# Patient Record
Sex: Male | Born: 1960 | Race: White | Hispanic: No | Marital: Married
Health system: Southern US, Community
[De-identification: ages and names within clinical notes are randomized; demographics above are authoritative.]

---

## 2001-01-31 ENCOUNTER — Ambulatory Visit (HOSPITAL_BASED_OUTPATIENT_CLINIC_OR_DEPARTMENT_OTHER): Admission: RE | Admit: 2001-01-31 | Discharge: 2001-01-31 | Payer: Self-pay | Admitting: Orthopedic Surgery

## 2006-08-06 ENCOUNTER — Encounter
Admission: RE | Admit: 2006-08-06 | Discharge: 2006-08-06 | Payer: Self-pay | Admitting: Physical Medicine and Rehabilitation

## 2006-08-22 ENCOUNTER — Emergency Department (HOSPITAL_COMMUNITY): Admission: EM | Admit: 2006-08-22 | Discharge: 2006-08-23 | Payer: Self-pay | Admitting: Emergency Medicine

## 2010-06-30 NOTE — Op Note (Signed)
[facility]  Patient:    Johnny Chavez, Johnny Chavez Visit Number: [id number] MRN: [id number]          Service Type: DSU Location: [facility] Attending Physician:  Milly Jakob Dictated by:   Harvie Junior, M.D. Proc. Date: 01/31/01 Admit Date:  01/31/2001                             Operative Report  PREOPERATIVE DIAGNOSIS:  Degenerative joint disease medial compartment with questionable lateral meniscal tear.  POSTOPERATIVE DIAGNOSES: 1. 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 2. Posterior cruciate ligament insufficiency. 3. Posterolateral meniscal tear. 4. Multiple osteocartilaginous loose bodies.  PRINCIPAL PROCEDURES: 1. Lateral meniscectomy. 2. Debridement of medial femoral condyle osteochondral defect. 3. Removal of multiple osteocartilaginous loose bodies.  SURGEON:  Harvie Junior, M.D.  ASSISTANT:  Currie Paris. Thedore Mins.  ANESTHESIA:  Knee block.  BRIEF HISTORY:  He is a 50 year old male with a long history of having left knee pain.  He has had two previous knee surgeries and because of continued complaints of pain and recent problems with catching and locking, he ultimately presented for knee arthroscopy.  X-rays preoperatively showed an interesting area on the medial femoral condyle and just prior to surgery he said, he was starting to have some catching on the lateral side.  Examination of the knee preoperatively with the knee block in showed that really he was posteriorly very loose.  He had a sag.  He did not have a quadriceps active and it was interestingly similar to his examination on the opposite side.  At this point, the knee was prepped and draped in usual sterile fashion and routine arthroscopic examination of the knee revealed that there was an obvious posterolateral meniscal tear.  This was debrided with a combination of straight biting forceps and the remaining meniscal rim was  contoured down with the suction/shaver.  Attention was turned to the lateral femoral condyle, where there was some grade 2 change.  Attention was then turned to the notch, where the ACL was noted to be normal.  The PCL was noted to be deficient. Attention was turned to the medial side, where there was noted to be some grade 4 change just in the mid body of the medial meniscus.  The meniscus was within normal limits.  Other than having had a previous partial meniscectomy probably a bucket handle type.  Continued flexion of the knee showed that there was an obvious rest of unstable cartilage.  This was probed and noted to be completely unstable.  This was debrided back to a smooth and stable rim. Attention was then turned up into the patellofemoral pouch, where the patellofemoral joint was within normal limits, but there were multiple osteocartilaginous loose bodies up in that area.  These were debrided through the suction/shaver.  Attention was turned back into the weightbearing compartment, where more loose bodies were removed and then back up into the notch to remove all of the osteocartilaginous loose bodies.  At this point, the knee was copiously irrigated and suctioned dry.  The arthroscopic portals were closed with a bandage.  A sterile and compressive dressing was applied and the patient taken to recovery room, where she was noted to be satisfactory condition.  Estimated blood loss for the procedure was none. Dictated by:   Harvie Junior, M.D. Attending Physician:  Milly Jakob DD:  01/31/01  TD:  02/01/01 Job: [id number]

## 2010-11-28 LAB — CBC
HCT: 39.6
Hemoglobin: 14
MCHC: 35.4
MCV: 87.7
RDW: 12.6

## 2010-11-28 LAB — COMPREHENSIVE METABOLIC PANEL
Alkaline Phosphatase: 93
BUN: 16
Creatinine, Ser: 1.03
Glucose, Bld: 122 — ABNORMAL HIGH
Potassium: 4.1
Total Protein: 6.2

## 2010-11-28 LAB — DIFFERENTIAL
Basophils Absolute: 0
Basophils Relative: 0
Monocytes Relative: 7
Neutro Abs: 4.3
Neutrophils Relative %: 84 — ABNORMAL HIGH

## 2010-11-28 LAB — URINALYSIS, ROUTINE W REFLEX MICROSCOPIC
Ketones, ur: NEGATIVE
Nitrite: NEGATIVE
Protein, ur: NEGATIVE
pH: 7.5

## 2010-11-28 LAB — CARBAMAZEPINE LEVEL, TOTAL: Carbamazepine Lvl: <2 — ABNORMAL LOW

## 2015-12-07 DIAGNOSIS — J209 Acute bronchitis, unspecified: Secondary | ICD-10-CM | POA: Diagnosis not present

## 2016-04-27 DIAGNOSIS — G8929 Other chronic pain: Secondary | ICD-10-CM | POA: Diagnosis not present

## 2016-04-27 DIAGNOSIS — G40909 Epilepsy, unspecified, not intractable, without status epilepticus: Secondary | ICD-10-CM | POA: Diagnosis not present

## 2016-04-27 DIAGNOSIS — M25512 Pain in left shoulder: Secondary | ICD-10-CM | POA: Diagnosis not present

## 2016-04-27 DIAGNOSIS — Z0000 Encounter for general adult medical examination without abnormal findings: Secondary | ICD-10-CM | POA: Diagnosis not present

## 2016-11-30 DIAGNOSIS — M546 Pain in thoracic spine: Secondary | ICD-10-CM | POA: Diagnosis not present

## 2016-12-11 DIAGNOSIS — M9901 Segmental and somatic dysfunction of cervical region: Secondary | ICD-10-CM | POA: Diagnosis not present

## 2016-12-11 DIAGNOSIS — M62830 Muscle spasm of back: Secondary | ICD-10-CM | POA: Diagnosis not present

## 2016-12-11 DIAGNOSIS — M9903 Segmental and somatic dysfunction of lumbar region: Secondary | ICD-10-CM | POA: Diagnosis not present

## 2016-12-11 DIAGNOSIS — M9902 Segmental and somatic dysfunction of thoracic region: Secondary | ICD-10-CM | POA: Diagnosis not present

## 2016-12-20 DIAGNOSIS — M62830 Muscle spasm of back: Secondary | ICD-10-CM | POA: Diagnosis not present

## 2016-12-20 DIAGNOSIS — M545 Low back pain: Secondary | ICD-10-CM | POA: Diagnosis not present

## 2016-12-21 DIAGNOSIS — M62830 Muscle spasm of back: Secondary | ICD-10-CM | POA: Diagnosis not present

## 2016-12-21 DIAGNOSIS — M545 Low back pain: Secondary | ICD-10-CM | POA: Diagnosis not present

## 2016-12-24 DIAGNOSIS — M62830 Muscle spasm of back: Secondary | ICD-10-CM | POA: Diagnosis not present

## 2016-12-24 DIAGNOSIS — M545 Low back pain: Secondary | ICD-10-CM | POA: Diagnosis not present

## 2016-12-28 DIAGNOSIS — M62830 Muscle spasm of back: Secondary | ICD-10-CM | POA: Diagnosis not present

## 2016-12-28 DIAGNOSIS — M545 Low back pain: Secondary | ICD-10-CM | POA: Diagnosis not present

## 2017-01-01 DIAGNOSIS — M62830 Muscle spasm of back: Secondary | ICD-10-CM | POA: Diagnosis not present

## 2017-01-01 DIAGNOSIS — M545 Low back pain: Secondary | ICD-10-CM | POA: Diagnosis not present

## 2017-05-06 DIAGNOSIS — Z125 Encounter for screening for malignant neoplasm of prostate: Secondary | ICD-10-CM | POA: Diagnosis not present

## 2017-05-06 DIAGNOSIS — Z5181 Encounter for therapeutic drug level monitoring: Secondary | ICD-10-CM | POA: Diagnosis not present

## 2017-05-06 DIAGNOSIS — Z0000 Encounter for general adult medical examination without abnormal findings: Secondary | ICD-10-CM | POA: Diagnosis not present

## 2017-05-06 DIAGNOSIS — G40909 Epilepsy, unspecified, not intractable, without status epilepticus: Secondary | ICD-10-CM | POA: Diagnosis not present

## 2017-07-02 DIAGNOSIS — M79674 Pain in right toe(s): Secondary | ICD-10-CM | POA: Diagnosis not present

## 2017-07-02 DIAGNOSIS — F17220 Nicotine dependence, chewing tobacco, uncomplicated: Secondary | ICD-10-CM | POA: Diagnosis not present

## 2017-07-02 DIAGNOSIS — S62521A Displaced fracture of distal phalanx of right thumb, initial encounter for closed fracture: Secondary | ICD-10-CM | POA: Diagnosis not present

## 2017-07-02 DIAGNOSIS — S91111A Laceration without foreign body of right great toe without damage to nail, initial encounter: Secondary | ICD-10-CM | POA: Diagnosis not present

## 2017-07-02 DIAGNOSIS — S92414B Nondisplaced fracture of proximal phalanx of right great toe, initial encounter for open fracture: Secondary | ICD-10-CM | POA: Diagnosis not present

## 2017-07-02 DIAGNOSIS — W19XXXA Unspecified fall, initial encounter: Secondary | ICD-10-CM | POA: Diagnosis not present

## 2017-07-04 DIAGNOSIS — S92421B Displaced fracture of distal phalanx of right great toe, initial encounter for open fracture: Secondary | ICD-10-CM | POA: Diagnosis not present

## 2017-07-16 DIAGNOSIS — S92421B Displaced fracture of distal phalanx of right great toe, initial encounter for open fracture: Secondary | ICD-10-CM | POA: Diagnosis not present

## 2017-12-16 DIAGNOSIS — H1032 Unspecified acute conjunctivitis, left eye: Secondary | ICD-10-CM | POA: Diagnosis not present

## 2018-08-04 DIAGNOSIS — R1032 Left lower quadrant pain: Secondary | ICD-10-CM | POA: Diagnosis not present

## 2018-08-06 ENCOUNTER — Other Ambulatory Visit: Payer: Self-pay | Admitting: Internal Medicine

## 2018-08-06 ENCOUNTER — Ambulatory Visit
Admission: RE | Admit: 2018-08-06 | Discharge: 2018-08-06 | Disposition: A | Discharge status: Home / Self Care | Payer: BC Managed Care – PPO | Source: Ambulatory Visit | Attending: Internal Medicine | Admitting: Internal Medicine

## 2018-08-06 DIAGNOSIS — R109 Unspecified abdominal pain: Secondary | ICD-10-CM | POA: Diagnosis not present

## 2018-08-06 DIAGNOSIS — R1010 Upper abdominal pain, unspecified: Secondary | ICD-10-CM | POA: Diagnosis not present

## 2018-08-18 DIAGNOSIS — Z0000 Encounter for general adult medical examination without abnormal findings: Secondary | ICD-10-CM | POA: Diagnosis not present

## 2018-08-18 DIAGNOSIS — Z5181 Encounter for therapeutic drug level monitoring: Secondary | ICD-10-CM | POA: Diagnosis not present

## 2018-08-18 DIAGNOSIS — R109 Unspecified abdominal pain: Secondary | ICD-10-CM | POA: Diagnosis not present

## 2018-08-18 DIAGNOSIS — M25562 Pain in left knee: Secondary | ICD-10-CM | POA: Diagnosis not present

## 2018-08-18 DIAGNOSIS — Z23 Encounter for immunization: Secondary | ICD-10-CM | POA: Diagnosis not present

## 2018-08-18 DIAGNOSIS — G40909 Epilepsy, unspecified, not intractable, without status epilepticus: Secondary | ICD-10-CM | POA: Diagnosis not present

## 2018-08-18 DIAGNOSIS — G8929 Other chronic pain: Secondary | ICD-10-CM | POA: Diagnosis not present

## 2018-11-03 DIAGNOSIS — Z23 Encounter for immunization: Secondary | ICD-10-CM | POA: Diagnosis not present

## 2018-12-16 DIAGNOSIS — M7702 Medial epicondylitis, left elbow: Secondary | ICD-10-CM | POA: Diagnosis not present

## 2018-12-16 DIAGNOSIS — M25562 Pain in left knee: Secondary | ICD-10-CM | POA: Diagnosis not present

## 2018-12-16 DIAGNOSIS — M1712 Unilateral primary osteoarthritis, left knee: Secondary | ICD-10-CM | POA: Diagnosis not present

## 2019-02-17 DIAGNOSIS — M71342 Other bursal cyst, left hand: Secondary | ICD-10-CM | POA: Diagnosis not present

## 2019-08-20 DIAGNOSIS — Z125 Encounter for screening for malignant neoplasm of prostate: Secondary | ICD-10-CM | POA: Diagnosis not present

## 2019-08-20 DIAGNOSIS — G40909 Epilepsy, unspecified, not intractable, without status epilepticus: Secondary | ICD-10-CM | POA: Diagnosis not present

## 2019-08-20 DIAGNOSIS — Z1159 Encounter for screening for other viral diseases: Secondary | ICD-10-CM | POA: Diagnosis not present

## 2019-08-20 DIAGNOSIS — Z0000 Encounter for general adult medical examination without abnormal findings: Secondary | ICD-10-CM | POA: Diagnosis not present

## 2019-08-20 DIAGNOSIS — Z5181 Encounter for therapeutic drug level monitoring: Secondary | ICD-10-CM | POA: Diagnosis not present

## 2019-09-10 DIAGNOSIS — M1712 Unilateral primary osteoarthritis, left knee: Secondary | ICD-10-CM | POA: Diagnosis not present

## 2019-09-10 DIAGNOSIS — M25562 Pain in left knee: Secondary | ICD-10-CM | POA: Diagnosis not present

## 2019-10-22 DIAGNOSIS — Z20822 Contact with and (suspected) exposure to covid-19: Secondary | ICD-10-CM | POA: Diagnosis not present

## 2019-11-30 ENCOUNTER — Other Ambulatory Visit: Payer: BC Managed Care – PPO

## 2019-11-30 DIAGNOSIS — Z20822 Contact with and (suspected) exposure to covid-19: Secondary | ICD-10-CM | POA: Diagnosis not present

## 2019-12-01 LAB — SARS-COV-2, NAA 2 DAY TAT

## 2019-12-01 LAB — NOVEL CORONAVIRUS, NAA: SARS-CoV-2, NAA: NOT DETECTED

## 2019-12-29 DIAGNOSIS — Z20822 Contact with and (suspected) exposure to covid-19: Secondary | ICD-10-CM | POA: Diagnosis not present

## 2020-01-14 DIAGNOSIS — Z01818 Encounter for other preprocedural examination: Secondary | ICD-10-CM | POA: Diagnosis not present

## 2020-01-25 ENCOUNTER — Other Ambulatory Visit: Payer: Self-pay

## 2020-01-25 ENCOUNTER — Ambulatory Visit (INDEPENDENT_AMBULATORY_CARE_PROVIDER_SITE_OTHER): Payer: BC Managed Care – PPO | Admitting: Family Medicine

## 2020-01-25 DIAGNOSIS — M1712 Unilateral primary osteoarthritis, left knee: Secondary | ICD-10-CM | POA: Diagnosis not present

## 2020-01-25 NOTE — Progress Notes (Signed)
I saw and examined the patient with Dr. Marga Hoots and agree with assessment and plan as outlined.    Chronic left knee pain, getting ready for replacement at end of month per Dr. Luiz Blare.  Wife wanted him to be sure he'd tried all conservative options.  Unfortunately, it appears that he's tried and failed most options.  I recommended proceeding with replacement as planned.  Also complaining of decreased flexion of left elbow since waterskiing injury several years ago.  No pain.  Recommended against intervention unless starts having more pain, since he's functional right now.

## 2020-01-25 NOTE — Progress Notes (Signed)
Office Visit Note   Patient: Johnny Chavez           Date of Birth: April 10, 1960           MRN: [id number] Visit Date: 01/25/2020 Requested by: Georgann Housekeeper, MD [address] [place],  [place] [address] PCP: Georgann Housekeeper, MD  Subjective: Chief Complaint  Patient presents with   Left Knee - Pain    H/o 4 surgeries on the knee since 1978. Here to get a 2nd opinion on getting a knee replacement. Has a knee replacement scheduled for 02/10/20.    HPI: 59yo M presenting to clinic for chronic knee pain/stiffness evaluation. Patient is scheduled to undergo a knee replacement in approximately two weeks, however he wanted to seek a second opinion if there is any more conservative options he can try. 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 He has tried multiple steroid shots with minimal benefit, and his insurance will not cover gel injections. He is active daily- usually using a stationary bike or elliptical. He is also an avid golfer. Previously, he was a runner, but says he has not done this for many years due to knee pain. He says his knee is 'constantly' swollen. He has worn knee sleeves without much relief. Has tried NSAIDs (Which started to cause easy bruising, so he discontinued). He says he is otherwise healthy.                ROS:   All other systems were reviewed and are negative.  Objective: Vital Signs: There were no vitals taken for this visit.  Physical Exam:  General:  Alert and oriented, in no acute distress. Pulm:  Breathing unlabored. Psy:  Normal mood, congruent affect. Skin:  Left knee with no bruising, rashes, or erythema. Overlying skin intact.   LEFT KNEE EXAM:  General: Antalgic gait, favoring left knee Standing exam: Mild varus deformity of the knee  Seated Exam:  Moderate patellar crepitus with flexion/extension, Negative J-Sign.   Palpation: Endorses tenderness to  palpation over ledial > medial joint lines. Some tenderness with patellar compression. No pain with palpation of patellar tendon.  Supine exam: Moderate effusion, normal patellar mobility.   Ligamentous Exam:  No pain or laxity with anterior/posterior drawer.  No obvious Sag.  Pseudolaxity with varus/valgus stress across the knee, though non-painful.   Meniscus:  McMurray with no pain or deep clicking.  Thessaly negative.   Strength: Hip flexion (L1), Hip Aduction (L2), Knee Extension (L3) are 5/5 Bilaterally Foot Inversion (L4), Dorsiflexion (L5), and Eversion (S1) 5/5 Bilaterally  Sensation: Intact to light touch medial and lateral aspects of lower extremities, and lateral, dorsal, and medial aspects of foot.    Imaging: No results found.  Assessment & Plan: 59yo M presenting to clinic for second opinion prior to pursuing knee replacement. Has already undergone steroid injections, with no benefit. Crepitus and effusion on examination. Discussed possible Prolo vs PRP injections, though evidence isn't outstanding for either. Discussed offloading brace. Given his relative youth and good health, as well as daily symptoms that are limiting his ability to participate fully in his desired activities (Golfing), suspect he would indeed benefit from knee replacement. Follow up with established surgeon as previously scheduled.      Procedures: No procedures performed        PMFS History: There are no problems to display for this patient.  No  past medical history on file.  No family history on file.   Social History   Occupational History   Not on file  Tobacco Use   Smoking status: Not on file   Smokeless tobacco: Not on file  Substance and Sexual Activity   Alcohol use: Not on file   Drug use: Not on file   Sexual activity: Not on file

## 2020-01-28 DIAGNOSIS — M25562 Pain in left knee: Secondary | ICD-10-CM | POA: Diagnosis not present

## 2020-01-28 DIAGNOSIS — Z20822 Contact with and (suspected) exposure to covid-19: Secondary | ICD-10-CM | POA: Diagnosis not present

## 2020-01-28 DIAGNOSIS — M1712 Unilateral primary osteoarthritis, left knee: Secondary | ICD-10-CM | POA: Diagnosis not present

## 2020-02-10 DIAGNOSIS — M1712 Unilateral primary osteoarthritis, left knee: Secondary | ICD-10-CM | POA: Diagnosis not present

## 2020-02-23 DIAGNOSIS — Z9889 Other specified postprocedural states: Secondary | ICD-10-CM | POA: Diagnosis not present

## 2020-03-30 DIAGNOSIS — Z20822 Contact with and (suspected) exposure to covid-19: Secondary | ICD-10-CM | POA: Diagnosis not present

## 2020-04-12 DIAGNOSIS — Z03818 Encounter for observation for suspected exposure to other biological agents ruled out: Secondary | ICD-10-CM | POA: Diagnosis not present

## 2020-04-12 DIAGNOSIS — Z20822 Contact with and (suspected) exposure to covid-19: Secondary | ICD-10-CM | POA: Diagnosis not present

## 2020-06-23 DIAGNOSIS — Z20822 Contact with and (suspected) exposure to covid-19: Secondary | ICD-10-CM | POA: Diagnosis not present

## 2020-07-18 DIAGNOSIS — T1512XA Foreign body in conjunctival sac, left eye, initial encounter: Secondary | ICD-10-CM | POA: Diagnosis not present

## 2020-08-16 DIAGNOSIS — Z09 Encounter for follow-up examination after completed treatment for conditions other than malignant neoplasm: Secondary | ICD-10-CM | POA: Diagnosis not present

## 2020-08-16 DIAGNOSIS — Z471 Aftercare following joint replacement surgery: Secondary | ICD-10-CM | POA: Diagnosis not present

## 2020-08-16 DIAGNOSIS — Z96652 Presence of left artificial knee joint: Secondary | ICD-10-CM | POA: Diagnosis not present

## 2020-08-23 DIAGNOSIS — G40909 Epilepsy, unspecified, not intractable, without status epilepticus: Secondary | ICD-10-CM | POA: Diagnosis not present

## 2020-08-23 DIAGNOSIS — Z0000 Encounter for general adult medical examination without abnormal findings: Secondary | ICD-10-CM | POA: Diagnosis not present

## 2020-11-01 DIAGNOSIS — M62838 Other muscle spasm: Secondary | ICD-10-CM | POA: Diagnosis not present

## 2020-11-01 DIAGNOSIS — M9905 Segmental and somatic dysfunction of pelvic region: Secondary | ICD-10-CM | POA: Diagnosis not present

## 2020-11-01 DIAGNOSIS — M9903 Segmental and somatic dysfunction of lumbar region: Secondary | ICD-10-CM | POA: Diagnosis not present

## 2020-11-01 DIAGNOSIS — M9902 Segmental and somatic dysfunction of thoracic region: Secondary | ICD-10-CM | POA: Diagnosis not present

## 2020-11-12 DIAGNOSIS — J209 Acute bronchitis, unspecified: Secondary | ICD-10-CM | POA: Diagnosis not present

## 2020-11-21 IMAGING — CT CT ABDOMEN AND PELVIS WITHOUT CONTRAST
2 of 4 series · 12 of 46 positions shown, 14 images · non-contrast
Comparison: No priors.

CLINICAL DATA: 58-year-old male with history of left-sided
abdominal pain for the past several days extending from the flank to
the pelvis.

EXAM:
CT ABDOMEN AND PELVIS WITHOUT CONTRAST
TECHNIQUE: Multidetector CT imaging of the abdomen and pelvis was performed
following the standard protocol without IV contrast.

[Series 2: renal stone 5.00 br40 s3 axial · axial · 0.58mm/px · z∈[+1175,+1595]mm · 9 of 101 slices shown, 11 images]
[im 9/101  soft-tissue]
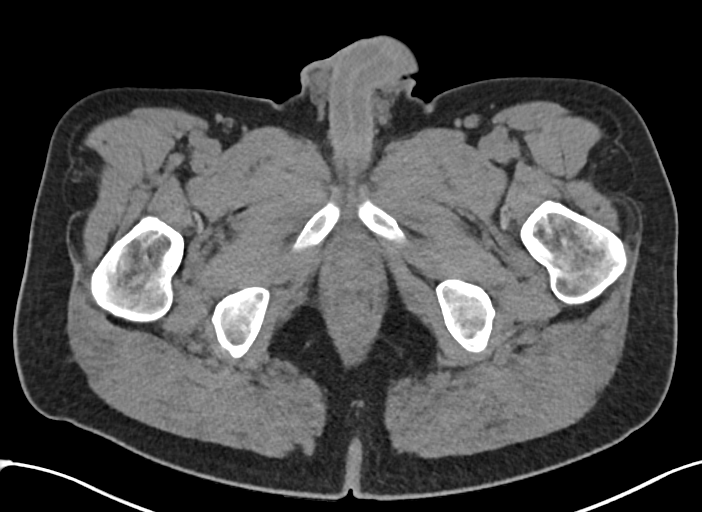
[im 9/101  bone]
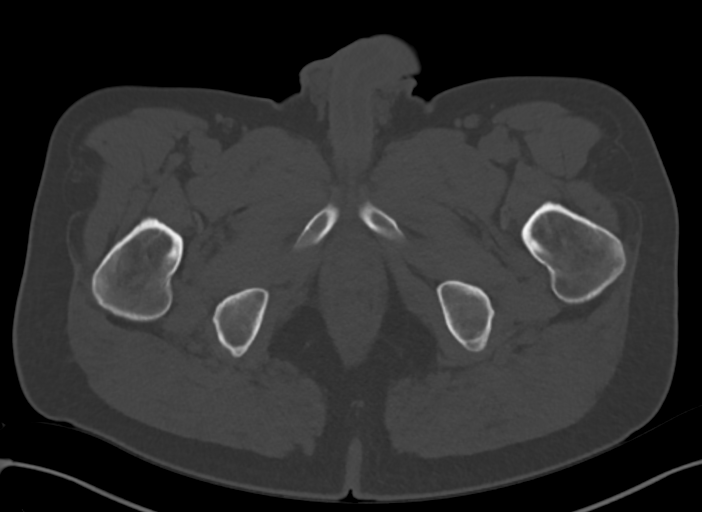
[im 21/101  soft-tissue]
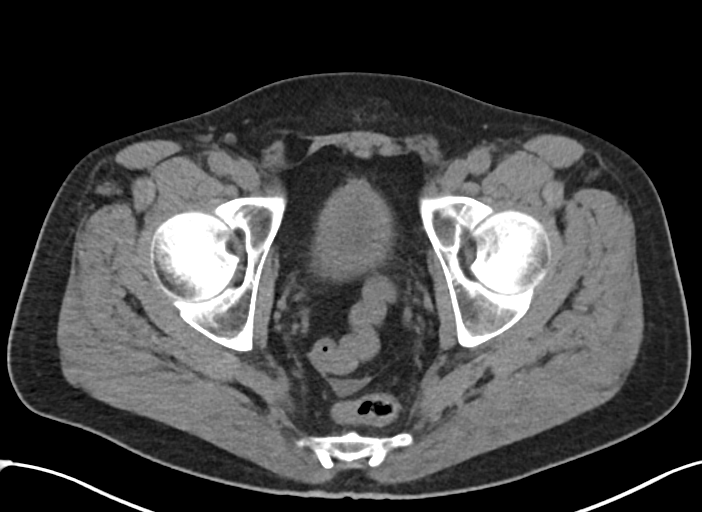
[im 29/101  soft-tissue]
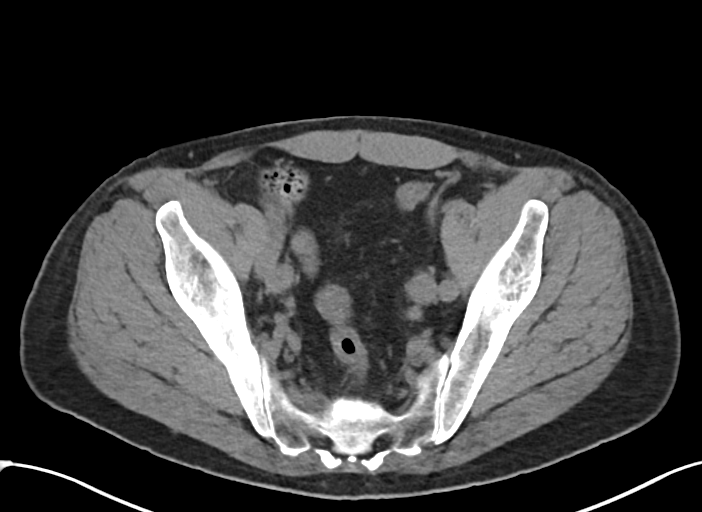
[im 41/101  soft-tissue]
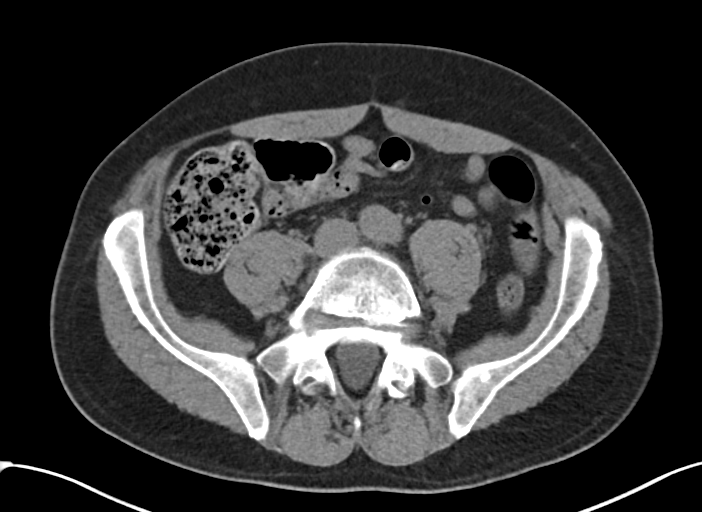
[im 53/101  soft-tissue]
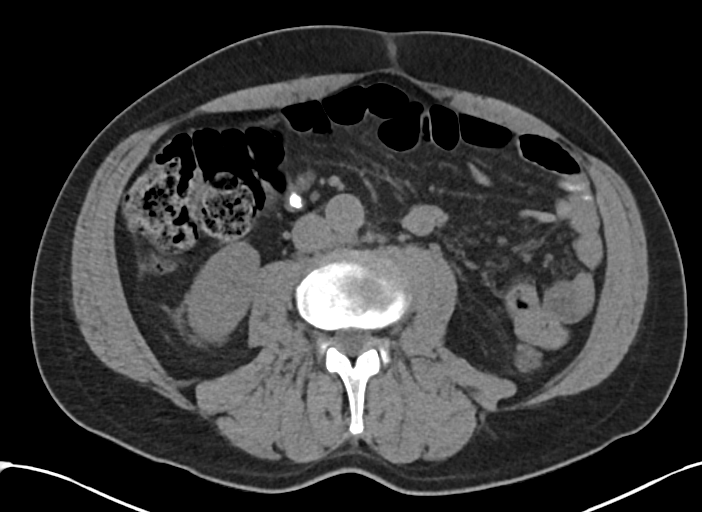
[im 61/101  soft-tissue]
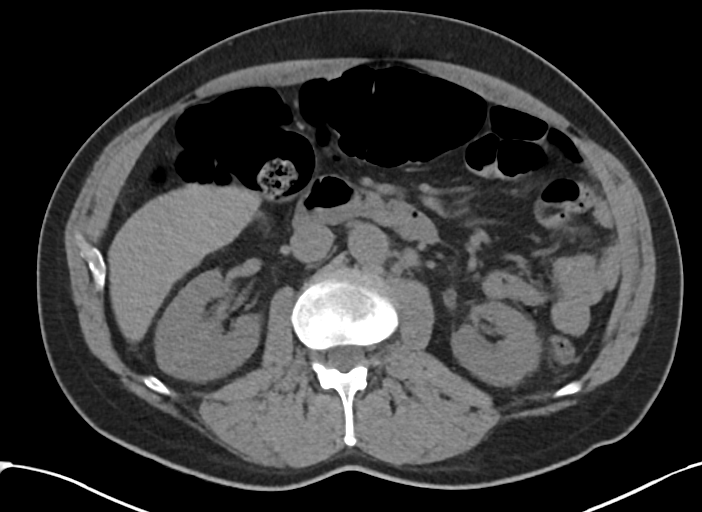
[im 73/101  soft-tissue]
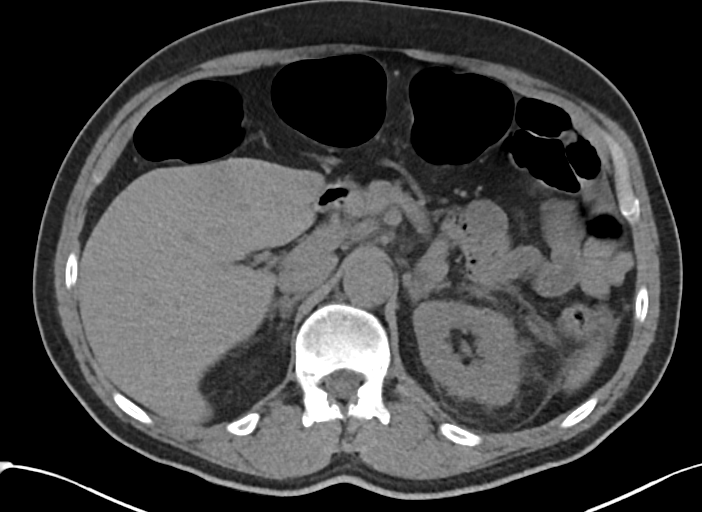
[im 85/101  soft-tissue]
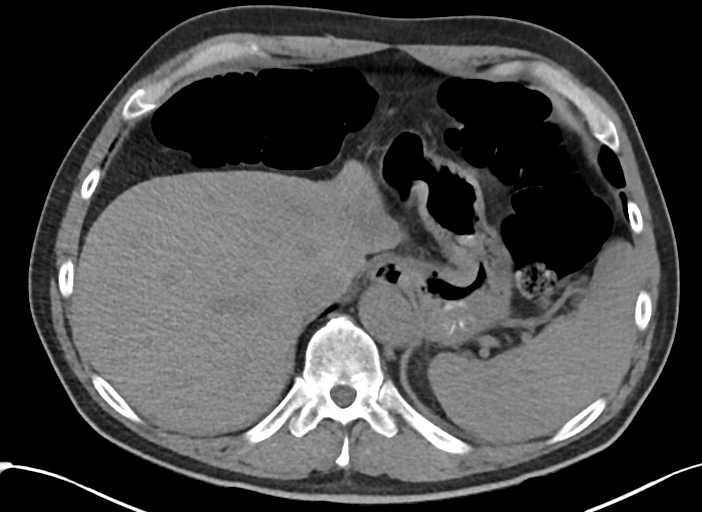
[im 93/101  soft-tissue]
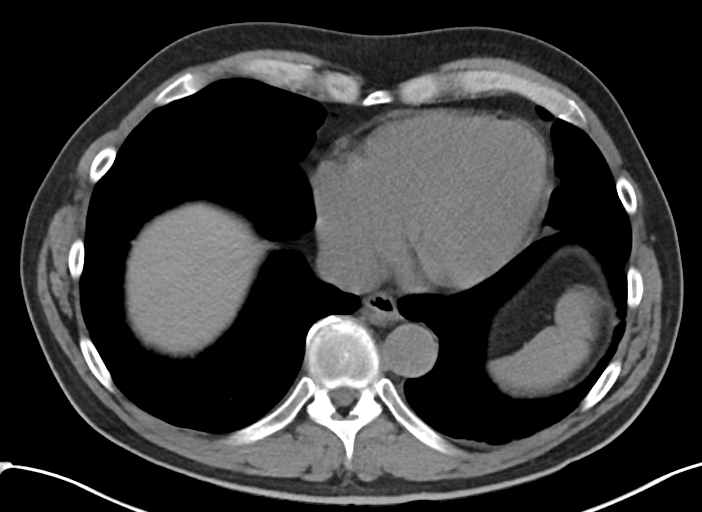
[im 93/101  bone]
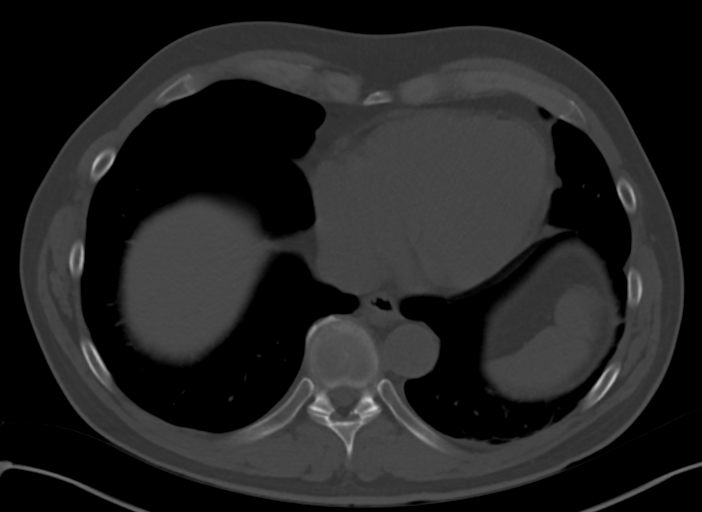

[Series 6: renal stone 2.00 br40 s3 cor · coronal · 0.79mm/px · 3 of 147 slices shown]
[im 49/147  soft-tissue]
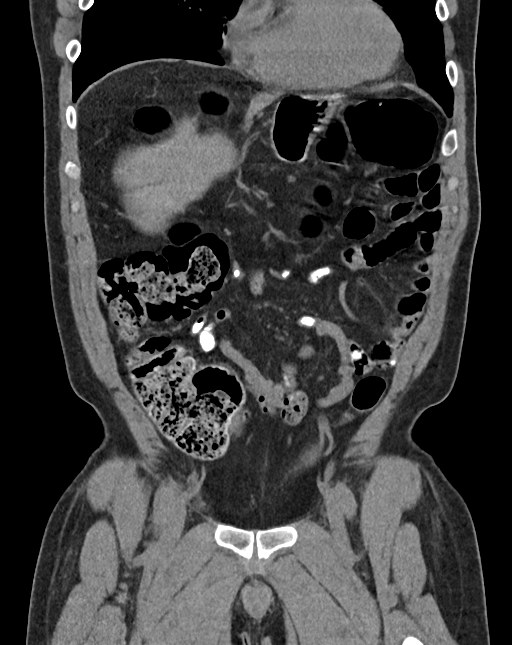
[im 65/147  soft-tissue]
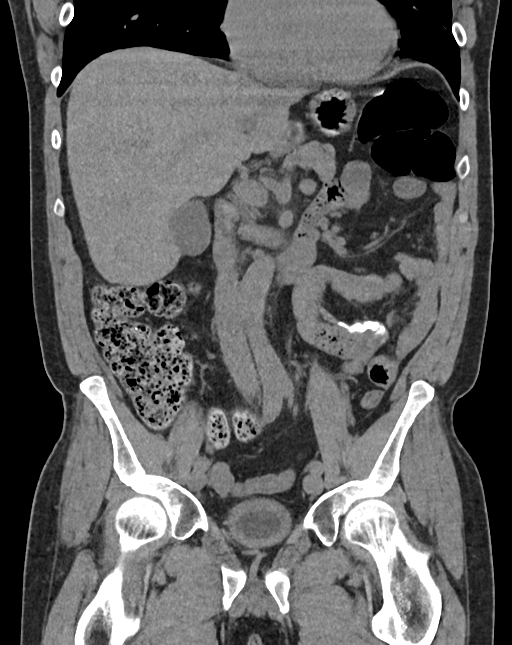
[im 82/147  soft-tissue]
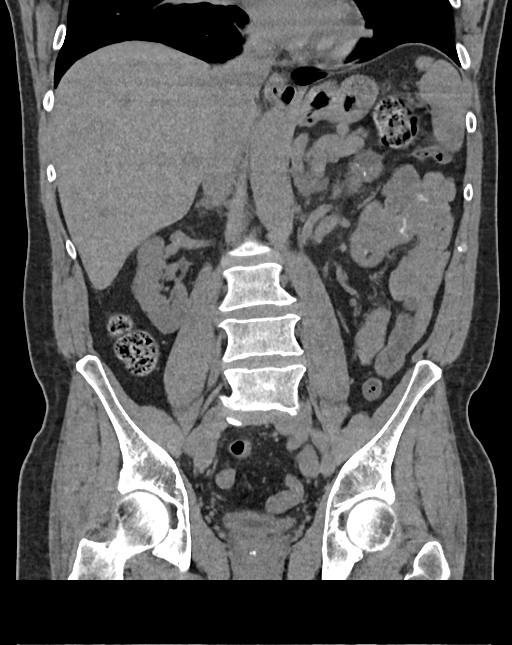

[12 of 46 positions shown; findings below may reference images not displayed]

FINDINGS: Lower chest: Unremarkable.

Hepatobiliary: No definite suspicious cystic or solid hepatic
lesions are confidently identified on today's noncontrast CT
examination. Small calcified granuloma in segment 5 of the liver.
Unenhanced appearance of the gallbladder is normal.

Pancreas: No definite pancreatic mass or peripancreatic fluid
collections or inflammatory changes are noted on today's noncontrast
CT examination.

Spleen: Unremarkable.

Adrenals/Urinary Tract: There are no abnormal calcifications within
the collecting system of either kidney, along the course of either
ureter, or within the lumen of the urinary bladder. No
hydroureteronephrosis to suggest urinary tract obstruction at this
time. However, there is extensive perinephric stranding adjacent to
the left kidney. The unenhanced appearance of the kidneys is
otherwise unremarkable bilaterally. Urinary bladder is normal in
appearance. Bilateral adrenal glands are normal in appearance.

Stomach/Bowel: Unenhanced appearance of the stomach is normal. No
pathologic dilatation of small bowel or colon. Normal appendix.

Vascular/Lymphatic: No atherosclerotic calcifications in the
abdominal aorta or pelvic vasculature. No lymphadenopathy noted in
the abdomen or pelvis.

Reproductive: Prostate gland and seminal vesicles are unremarkable
in appearance.

Other: No significant volume of ascites.  No pneumoperitoneum.

Musculoskeletal: There are no aggressive appearing lytic or blastic
lesions noted in the visualized portions of the skeleton.
IMPRESSION: 1. Extensive perinephric stranding adjacent to the left kidney. No
urinary tract calculi or hydroureteronephrosis to suggest current
urinary tract obstruction. Findings are nonspecific and could relate
to upper tract infection such as pyelonephritis, or could indicate
recent passage of a stone.

## 2021-01-25 DIAGNOSIS — H31003 Unspecified chorioretinal scars, bilateral: Secondary | ICD-10-CM | POA: Diagnosis not present

## 2021-02-23 DIAGNOSIS — M25562 Pain in left knee: Secondary | ICD-10-CM | POA: Diagnosis not present

## 2021-02-23 DIAGNOSIS — M1712 Unilateral primary osteoarthritis, left knee: Secondary | ICD-10-CM | POA: Diagnosis not present

## 2021-03-06 DIAGNOSIS — R002 Palpitations: Secondary | ICD-10-CM | POA: Diagnosis not present

## 2021-03-06 DIAGNOSIS — R001 Bradycardia, unspecified: Secondary | ICD-10-CM | POA: Diagnosis not present

## 2021-03-07 DIAGNOSIS — R002 Palpitations: Secondary | ICD-10-CM | POA: Diagnosis not present

## 2021-03-30 DIAGNOSIS — R002 Palpitations: Secondary | ICD-10-CM | POA: Diagnosis not present

## 2021-08-29 DIAGNOSIS — R030 Elevated blood-pressure reading, without diagnosis of hypertension: Secondary | ICD-10-CM | POA: Diagnosis not present

## 2021-08-29 DIAGNOSIS — Z0000 Encounter for general adult medical examination without abnormal findings: Secondary | ICD-10-CM | POA: Diagnosis not present

## 2021-08-29 DIAGNOSIS — G40909 Epilepsy, unspecified, not intractable, without status epilepticus: Secondary | ICD-10-CM | POA: Diagnosis not present

## 2021-08-29 DIAGNOSIS — I471 Supraventricular tachycardia: Secondary | ICD-10-CM | POA: Diagnosis not present

## 2021-08-29 DIAGNOSIS — Z1159 Encounter for screening for other viral diseases: Secondary | ICD-10-CM | POA: Diagnosis not present

## 2021-08-29 DIAGNOSIS — Z125 Encounter for screening for malignant neoplasm of prostate: Secondary | ICD-10-CM | POA: Diagnosis not present

## 2021-08-29 DIAGNOSIS — Z13220 Encounter for screening for lipoid disorders: Secondary | ICD-10-CM | POA: Diagnosis not present

## 2022-02-23 DIAGNOSIS — K635 Polyp of colon: Secondary | ICD-10-CM | POA: Diagnosis not present

## 2022-02-23 DIAGNOSIS — K648 Other hemorrhoids: Secondary | ICD-10-CM | POA: Diagnosis not present

## 2022-02-23 DIAGNOSIS — K5730 Diverticulosis of large intestine without perforation or abscess without bleeding: Secondary | ICD-10-CM | POA: Diagnosis not present

## 2022-02-23 DIAGNOSIS — Z1211 Encounter for screening for malignant neoplasm of colon: Secondary | ICD-10-CM | POA: Diagnosis not present

## 2022-03-28 DIAGNOSIS — L578 Other skin changes due to chronic exposure to nonionizing radiation: Secondary | ICD-10-CM | POA: Diagnosis not present

## 2022-03-28 DIAGNOSIS — L821 Other seborrheic keratosis: Secondary | ICD-10-CM | POA: Diagnosis not present

## 2022-03-28 DIAGNOSIS — I781 Nevus, non-neoplastic: Secondary | ICD-10-CM | POA: Diagnosis not present

## 2022-03-28 DIAGNOSIS — L820 Inflamed seborrheic keratosis: Secondary | ICD-10-CM | POA: Diagnosis not present

## 2022-06-04 DIAGNOSIS — H31003 Unspecified chorioretinal scars, bilateral: Secondary | ICD-10-CM | POA: Diagnosis not present

## 2022-07-09 DIAGNOSIS — H1032 Unspecified acute conjunctivitis, left eye: Secondary | ICD-10-CM | POA: Diagnosis not present

## 2023-01-01 ENCOUNTER — Encounter: Payer: Self-pay | Admitting: Physician Assistant

## 2023-01-02 ENCOUNTER — Other Ambulatory Visit: Payer: Self-pay | Admitting: Physician Assistant

## 2023-01-02 DIAGNOSIS — Z87898 Personal history of other specified conditions: Secondary | ICD-10-CM

## 2023-01-02 DIAGNOSIS — R410 Disorientation, unspecified: Secondary | ICD-10-CM

## 2023-04-30 ENCOUNTER — Ambulatory Visit
Admission: RE | Admit: 2023-04-30 | Discharge: 2023-04-30 | Disposition: A | Discharge status: Home / Self Care | Source: Ambulatory Visit | Attending: Physician Assistant | Admitting: Physician Assistant

## 2023-04-30 ENCOUNTER — Other Ambulatory Visit: Payer: Self-pay | Admitting: Physician Assistant

## 2023-04-30 DIAGNOSIS — R509 Fever, unspecified: Secondary | ICD-10-CM

## 2023-04-30 DIAGNOSIS — R051 Acute cough: Secondary | ICD-10-CM

## 2023-04-30 DIAGNOSIS — R0989 Other specified symptoms and signs involving the circulatory and respiratory systems: Secondary | ICD-10-CM

## 2023-06-13 ENCOUNTER — Other Ambulatory Visit (HOSPITAL_COMMUNITY): Payer: Self-pay | Admitting: Physician Assistant

## 2023-06-13 DIAGNOSIS — R9389 Abnormal findings on diagnostic imaging of other specified body structures: Secondary | ICD-10-CM

## 2023-06-17 ENCOUNTER — Encounter (HOSPITAL_BASED_OUTPATIENT_CLINIC_OR_DEPARTMENT_OTHER): Payer: Self-pay

## 2023-06-17 ENCOUNTER — Ambulatory Visit (HOSPITAL_BASED_OUTPATIENT_CLINIC_OR_DEPARTMENT_OTHER)
